# Patient Record
Sex: Male | Born: 1970 | Race: Black or African American | Hispanic: No | Marital: Single | Smoking: Current every day smoker
Health system: Southern US, Community
[De-identification: ages and names within clinical notes are randomized; demographics above are authoritative.]

## PROBLEM LIST (undated history)

## (undated) DIAGNOSIS — M5136 Other intervertebral disc degeneration, lumbar region: Secondary | ICD-10-CM

## (undated) DIAGNOSIS — G8929 Other chronic pain: Secondary | ICD-10-CM

## (undated) DIAGNOSIS — M549 Dorsalgia, unspecified: Secondary | ICD-10-CM

## (undated) HISTORY — PX: ORTHOPEDIC SURGERY: SHX850

---

## 2004-08-20 ENCOUNTER — Ambulatory Visit (HOSPITAL_COMMUNITY): Admission: RE | Admit: 2004-08-20 | Discharge: 2004-08-20 | Payer: Self-pay | Admitting: Unknown Physician Specialty

## 2004-08-30 ENCOUNTER — Ambulatory Visit (HOSPITAL_COMMUNITY): Admission: RE | Admit: 2004-08-30 | Discharge: 2004-08-30 | Payer: Self-pay | Admitting: Preventative Medicine

## 2004-09-11 ENCOUNTER — Emergency Department (HOSPITAL_COMMUNITY): Admission: EM | Admit: 2004-09-11 | Discharge: 2004-09-11 | Payer: Self-pay | Admitting: Emergency Medicine

## 2006-11-15 ENCOUNTER — Emergency Department (HOSPITAL_COMMUNITY): Admission: EM | Admit: 2006-11-15 | Discharge: 2006-11-15 | Payer: Self-pay | Admitting: Emergency Medicine

## 2008-01-03 ENCOUNTER — Emergency Department (HOSPITAL_COMMUNITY): Admission: EM | Admit: 2008-01-03 | Discharge: 2008-01-03 | Payer: Self-pay | Admitting: Emergency Medicine

## 2008-06-13 ENCOUNTER — Emergency Department (HOSPITAL_COMMUNITY): Admission: EM | Admit: 2008-06-13 | Discharge: 2008-06-13 | Payer: Self-pay | Admitting: Emergency Medicine

## 2011-01-14 NOTE — Consult Note (Signed)
NAME:  Riley Montgomery, Riley Montgomery NO.:  [id number]   MEDICAL RECORD NO.:  [id number]          PATIENT TYPE:  EMS   LOCATION:  MAJO                         FACILITY:  [facility]   PHYSICIAN:  Karol T. Lazarus Salines, M.D. DATE OF BIRTH:  1971/03/07   DATE OF CONSULTATION:  01/03/2008  DATE OF DISCHARGE:  01/03/2008                                 CONSULTATION   CHIEF COMPLAINT:  Severe sore throat.   HISTORY:  A 40 year old black male began with a fairly severe left-sided  sore throat 4-5 days ago.  He really has not had any to eat since that  time.  He is drinking with difficulty.  He has had low-grade fevers,  undocumented.  Trismus, left referred ear pain, left neck swelling, and  tenderness.  He was seen in the emergency room at [facility] 2  days ago and given a shot of antibiotics which he cannot name.  He has  continued to have trouble.  He saw Dr. Freida Busman at the [facility] Emergency  Room this evening who diagnosed him with a left peritonsillar abscess  and sent him down for further consideration.  He has never had problems  like this before including tonsillitis or strep throat.  He is not  diabetic nor HIV positive.  No known immune compromise.  No medical  allergies.   PAST MEDICAL HISTORY:  No known allergies.   CURRENT MEDICATIONS:  No current medications.   SOCIAL HISTORY:  He is working in [place].  He is not married.  He does  smoke.   FAMILY HISTORY:  Noncontributory.   REVIEW OF SYSTEMS:  Noncontributory.   PHYSICAL EXAMINATION:  GENERAL:  This is a distressed young middle-aged  black male.  Mental status is appropriate.  He hears well in  conversational speech.  Voice is phonatory, but with labored  articulation consistent with pain.  He is clearing his throat and  spitting frequently.  He is mildly warm to touch.  HEENT:  Head is atraumatic and neck supple.  Cranial nerves grossly  intact.  Ear canals are clear with normal aerated drums.  Anterior nose  is  clear and noncongested.  Oral cavity is moist with teeth in good  repair.  Oropharynx shows fairly massive uvular translucent edema with a  shift of the midline of the soft palate towards the left.  There is  erythematous tenderness with mild bulging of the left hemi soft palate.  The tonsil shows no active exudate.  The posterior tonsillar pillars are  also edematous.  The right tonsil is 2+ without exudate.  NECK:  With some tender adenopathy in the left jugulodigastric region.   IMPRESSION:  Left peritonsillar abscess versus cellulitis with the  extreme level of pain, it is probably a deep abscess, although the time  duration is relatively short.   PLAN:  I discussed this with him.  I think the options are to cover him  with appropriate antibiotics or to go ahead and perform incision and  drainage.  With the uvular edema and the degree of pain, I think opening  this is appropriate.  I discussed this with the patient including risks  and complications.  Questions were answered and informed consent was  obtained.   Anesthesia was begun with topical Cetacaine spray followed by 1%  Xylocaine with 1:100,000 epinephrine, 10 mL total in stages.  Midway  through the anesthesia, he was having substantial aggravation of his gag  reflex with dry heaves.  We gave him 6 mg of IV Zofran which helped with  the nausea.   After allowing adequate time for the local anesthetic to take effect, a  2-cm crescent incision was made over the superior pole of the left  tonsil and carried down to the superior pole.  No abscess pocket was  encountered.  Using a blunt tonsil hemostat, the posterior aspect of  superior pole and down to the mid pole was opened with significant pain.  Minimal pus was encountered.  The patient had mild bleeding which  stopped spontaneously with the use of peroxide and saline gargles.  We  did give him 4 mg of IV morphine at the conclusion of the procedure to  help him with the  pain.   I gave him prescriptions for amoxicillin liquid 1 g t.i.d. for 3 days to  back off the 1/2 g t.i.d. for the remainder of 10 days.  I gave him  prescriptions for Tylenol with Codeine liquid for pain relief and  Roxicet liquid for severe pain relief.  I will see him back in my office  in 1-2 weeks' if he is doing nicely.  If he is still having trouble, we  will see him sooner.  Because he does not have a substantial past  history of tonsil problems, I do not think he is a candidate for  tonsillectomy.  He understands and agrees with the discussion and plans.      Gloris Manchester. Lazarus Salines, M.D.  Electronically Signed     KTW/MEDQ  D:  01/03/2008  T:  01/04/2008  Job:  [id number]

## 2011-06-03 LAB — DIFFERENTIAL
Basophils Absolute: 0
Basophils Relative: 1
Eosinophils Absolute: 0
Eosinophils Relative: 0
Lymphocytes Relative: 37
Lymphs Abs: 1.6
Monocytes Absolute: 0.4
Monocytes Relative: 9
Neutro Abs: 2.3
Neutrophils Relative %: 54

## 2011-06-03 LAB — BASIC METABOLIC PANEL
BUN: 12
CO2: 20
Calcium: 8.9
Chloride: 96
Creatinine, Ser: 0.99
GFR calc Af Amer: >60
GFR calc non Af Amer: >60
Glucose, Bld: 74
Potassium: 4
Sodium: 134 — ABNORMAL LOW

## 2011-06-03 LAB — CBC
HCT: 54 — ABNORMAL HIGH
Hemoglobin: 18.7 — ABNORMAL HIGH
MCHC: 34.6
MCV: 92.6
Platelets: 158
RBC: 5.84 — ABNORMAL HIGH
RDW: 14.7
WBC: 4.2

## 2011-06-03 LAB — RAPID URINE DRUG SCREEN, HOSP PERFORMED
Amphetamines: NOT DETECTED
Barbiturates: NOT DETECTED
Benzodiazepines: NOT DETECTED
Cocaine: NOT DETECTED
Opiates: NOT DETECTED
Tetrahydrocannabinol: NOT DETECTED

## 2011-06-03 LAB — ETHANOL: Alcohol, Ethyl (B): 264 — ABNORMAL HIGH

## 2012-11-21 ENCOUNTER — Emergency Department (HOSPITAL_COMMUNITY): Payer: No Typology Code available for payment source

## 2012-11-21 ENCOUNTER — Emergency Department (HOSPITAL_COMMUNITY)
Admission: EM | Admit: 2012-11-21 | Discharge: 2012-11-21 | Disposition: A | Discharge status: Home / Self Care | Payer: No Typology Code available for payment source | Attending: Emergency Medicine | Admitting: Emergency Medicine

## 2012-11-21 ENCOUNTER — Encounter (HOSPITAL_COMMUNITY): Payer: Self-pay

## 2012-11-21 DIAGNOSIS — F17200 Nicotine dependence, unspecified, uncomplicated: Secondary | ICD-10-CM | POA: Insufficient documentation

## 2012-11-21 DIAGNOSIS — S161XXA Strain of muscle, fascia and tendon at neck level, initial encounter: Secondary | ICD-10-CM

## 2012-11-21 DIAGNOSIS — S139XXA Sprain of joints and ligaments of unspecified parts of neck, initial encounter: Secondary | ICD-10-CM | POA: Insufficient documentation

## 2012-11-21 DIAGNOSIS — S335XXA Sprain of ligaments of lumbar spine, initial encounter: Secondary | ICD-10-CM | POA: Insufficient documentation

## 2012-11-21 DIAGNOSIS — Y92410 Unspecified street and highway as the place of occurrence of the external cause: Secondary | ICD-10-CM | POA: Insufficient documentation

## 2012-11-21 DIAGNOSIS — S39012D Strain of muscle, fascia and tendon of lower back, subsequent encounter: Secondary | ICD-10-CM

## 2012-11-21 DIAGNOSIS — Y9389 Activity, other specified: Secondary | ICD-10-CM | POA: Insufficient documentation

## 2012-11-21 DIAGNOSIS — Z8739 Personal history of other diseases of the musculoskeletal system and connective tissue: Secondary | ICD-10-CM | POA: Insufficient documentation

## 2012-11-21 HISTORY — DX: Other intervertebral disc degeneration, lumbar region: M51.36

## 2012-11-21 HISTORY — DX: Dorsalgia, unspecified: M54.9

## 2012-11-21 HISTORY — DX: Other chronic pain: G89.29

## 2012-11-21 MED ORDER — OXYCODONE-ACETAMINOPHEN 5-325 MG PO TABS: ORAL_TABLET | ORAL | Status: DC

## 2012-11-21 MED ORDER — OXYCODONE-ACETAMINOPHEN 5-325 MG PO TABS
2.0000 | ORAL_TABLET | Freq: Once | ORAL | Status: AC
  Administered 2012-11-21: 2 via ORAL
  Filled 2012-11-21: qty 2
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
## 2012-11-21 NOTE — ED Notes (Signed)
Patient states pain is just a little better.

## 2013-02-13 ENCOUNTER — Encounter (HOSPITAL_COMMUNITY): Payer: Self-pay | Admitting: Emergency Medicine

## 2013-02-13 ENCOUNTER — Emergency Department (HOSPITAL_COMMUNITY): Payer: Worker's Compensation

## 2013-02-13 ENCOUNTER — Emergency Department (HOSPITAL_COMMUNITY)
Admission: EM | Admit: 2013-02-13 | Discharge: 2013-02-13 | Disposition: A | Discharge status: Home / Self Care | Payer: Worker's Compensation | Attending: Emergency Medicine | Admitting: Emergency Medicine

## 2013-02-13 DIAGNOSIS — Z8739 Personal history of other diseases of the musculoskeletal system and connective tissue: Secondary | ICD-10-CM | POA: Insufficient documentation

## 2013-02-13 DIAGNOSIS — Y929 Unspecified place or not applicable: Secondary | ICD-10-CM | POA: Insufficient documentation

## 2013-02-13 DIAGNOSIS — S63509A Unspecified sprain of unspecified wrist, initial encounter: Secondary | ICD-10-CM | POA: Insufficient documentation

## 2013-02-13 DIAGNOSIS — M549 Dorsalgia, unspecified: Secondary | ICD-10-CM | POA: Insufficient documentation

## 2013-02-13 DIAGNOSIS — Y990 Civilian activity done for income or pay: Secondary | ICD-10-CM | POA: Insufficient documentation

## 2013-02-13 DIAGNOSIS — Y9389 Activity, other specified: Secondary | ICD-10-CM | POA: Insufficient documentation

## 2013-02-13 DIAGNOSIS — F17200 Nicotine dependence, unspecified, uncomplicated: Secondary | ICD-10-CM | POA: Insufficient documentation

## 2013-02-13 DIAGNOSIS — X500XXA Overexertion from strenuous movement or load, initial encounter: Secondary | ICD-10-CM | POA: Insufficient documentation

## 2013-02-13 DIAGNOSIS — G8929 Other chronic pain: Secondary | ICD-10-CM | POA: Insufficient documentation

## 2013-02-13 DIAGNOSIS — S63501A Unspecified sprain of right wrist, initial encounter: Secondary | ICD-10-CM

## 2013-02-13 MED ORDER — IBUPROFEN 600 MG PO TABS: 600.0000 mg | ORAL_TABLET | Freq: Four times a day (QID) | ORAL | Status: DC | PRN

## 2013-02-13 MED ORDER — IBUPROFEN 800 MG PO TABS
800.0000 mg | ORAL_TABLET | Freq: Once | ORAL | Status: AC
Start: 2013-02-13 — End: 2013-02-13
  Administered 2013-02-13: 800 mg via ORAL
  Filled 2013-02-13: qty 1

## 2013-02-13 MED ORDER — OXYCODONE-ACETAMINOPHEN 5-325 MG PO TABS: 1.0000 | ORAL_TABLET | Freq: Three times a day (TID) | ORAL | Status: DC | PRN

## 2013-02-13 NOTE — ED Provider Notes (Signed)
History  This chart was scribed for Riley Gaskins, MD by Greggory Stallion, ED Scribe. This patient was seen in room APA06/APA06 and the patient's care was started at 7:07 AM.  CSN: [id number]  Arrival date & time 02/13/13  2956    Chief Complaint  Patient presents with  . Hand Injury    Patient is a 42 y.o. male presenting with hand injury. The history is provided by the patient. No language interpreter was used.  Hand Injury Location:  Wrist and hand Time since incident:  1 hour Injury: yes   Wrist location:  R wrist Hand location:  R hand Pain details:    Severity:  Mild   Onset quality:  Gradual   Timing:  Constant Chronicity:  New Dislocation: no   Foreign body present:  No foreign bodies Relieved by:  None tried Worsened by:  Movement Ineffective treatments:  None tried   HPI Comments: Riley Montgomery is a 42 y.o. male who presents to the Emergency Department complaining of gradual onset, constant right hand pain that started earlier this morning. Pt states he was breaking down boxes at work and he heard a pop in his hand with a shooting pain. Pt states the pain when away then a few minutes later his wrist started swelling really bad. Pt denies any other medical problems at this time.  Past Medical History  Diagnosis Date  . Chronic back pain   . DDD (degenerative disc disease), lumbar     No past surgical history on file.  No family history on file.  History  Substance Use Topics  . Smoking status: Current Some Day Smoker  . Smokeless tobacco: Not on file  . Alcohol Use: No      Review of Systems  Gastrointestinal: Negative for abdominal pain.  Musculoskeletal: Positive for joint swelling (wrist).    Allergies  Review of patient's allergies indicates no known allergies.  Home Medications   Current Outpatient Rx  Name  Route  Sig  Dispense  Refill  . methocarbamol (ROBAXIN) 750 MG tablet   Oral   Take 1,500 mg by mouth 4 (four) times daily as  needed (for pain).         Marland Kitchen oxyCODONE-acetaminophen (PERCOCET/ROXICET) 5-325 MG per tablet      1 or 2 tabs PO q6h prn pain   20 tablet   0     BP 144/92  Pulse 119  Temp(Src) 98.6 F (37 C) (Oral)  Resp 20  Ht 5\' 9"  (1.753 m)  Wt 230 lb (104.327 kg)  BMI 33.95 kg/m2  SpO2 99%  Physical Exam  CONSTITUTIONAL: Well developed/well nourished HEAD: Normocephalic/atraumatic EYES: EOMI/PERRL  ENMT: Mucous membranes moist NECK: supple no meningeal signs SPINE:entire spine nontender CV: S1/S2 noted, no murmurs/rubs/gallops noted LUNGS: Lungs are clear to auscultation bilaterally, no apparent distress ABDOMEN: soft, nontender, no rebound or guarding GU:no cva tenderness NEURO: Pt is awake/alert, moves all extremitiesx4 EXTREMITIES: pulses normal, full ROM, swelling and tenderness over the right wrist mostly over the snuff box but no erythema or laceration/abrasion.  Minimal warmth.  He can range the right wrist, most of his pain is with radial deviation of wrist.  No deformity SKIN: warm, color normal PSYCH: no abnormalities of mood noted   ED Course  Procedures   DIAGNOSTIC STUDIES: Oxygen Saturation is 99% on RA, normal by my interpretation.    COORDINATION OF CARE: 7:13 AM-Discussed treatment plan with pt at bedside and pt agreed to plan.  Plan for velcro thumb spica, will f/u this week with ortho Short course of pain meds given     MDM  Nursing notes including past medical history and social history reviewed and considered in documentation xrays reviewed and considered       I personally performed the services described in this documentation, which was scribed in my presence. The recorded information has been reviewed and is accurate.     Riley Gaskins, MD 02/13/13 [phone]

## 2013-02-13 NOTE — ED Notes (Signed)
States that he was at work and felt and heard something pop in his right wrist and hand and started having pain and swelling.

## 2013-03-08 ENCOUNTER — Other Ambulatory Visit: Payer: Self-pay | Admitting: Orthopedic Surgery

## 2013-03-08 ENCOUNTER — Ambulatory Visit
Admission: RE | Admit: 2013-03-08 | Discharge: 2013-03-08 | Disposition: A | Discharge status: Home / Self Care | Payer: Worker's Compensation | Source: Ambulatory Visit | Attending: Orthopedic Surgery | Admitting: Orthopedic Surgery

## 2013-03-08 DIAGNOSIS — M25531 Pain in right wrist: Secondary | ICD-10-CM

## 2013-03-08 DIAGNOSIS — R609 Edema, unspecified: Secondary | ICD-10-CM

## 2013-11-15 ENCOUNTER — Emergency Department (HOSPITAL_COMMUNITY): Payer: Self-pay

## 2013-11-15 ENCOUNTER — Emergency Department (HOSPITAL_COMMUNITY)
Admission: EM | Admit: 2013-11-15 | Discharge: 2013-11-15 | Disposition: A | Discharge status: Home / Self Care | Payer: Self-pay | Attending: Emergency Medicine | Admitting: Emergency Medicine

## 2013-11-15 ENCOUNTER — Encounter (HOSPITAL_COMMUNITY): Payer: Self-pay | Admitting: Emergency Medicine

## 2013-11-15 DIAGNOSIS — R296 Repeated falls: Secondary | ICD-10-CM | POA: Insufficient documentation

## 2013-11-15 DIAGNOSIS — M25531 Pain in right wrist: Secondary | ICD-10-CM

## 2013-11-15 DIAGNOSIS — F17200 Nicotine dependence, unspecified, uncomplicated: Secondary | ICD-10-CM | POA: Insufficient documentation

## 2013-11-15 DIAGNOSIS — S59919A Unspecified injury of unspecified forearm, initial encounter: Principal | ICD-10-CM

## 2013-11-15 DIAGNOSIS — Z8739 Personal history of other diseases of the musculoskeletal system and connective tissue: Secondary | ICD-10-CM | POA: Insufficient documentation

## 2013-11-15 DIAGNOSIS — S6990XA Unspecified injury of unspecified wrist, hand and finger(s), initial encounter: Principal | ICD-10-CM | POA: Insufficient documentation

## 2013-11-15 DIAGNOSIS — S59909A Unspecified injury of unspecified elbow, initial encounter: Secondary | ICD-10-CM | POA: Insufficient documentation

## 2013-11-15 DIAGNOSIS — Y9389 Activity, other specified: Secondary | ICD-10-CM | POA: Insufficient documentation

## 2013-11-15 DIAGNOSIS — G8929 Other chronic pain: Secondary | ICD-10-CM | POA: Insufficient documentation

## 2013-11-15 DIAGNOSIS — Y929 Unspecified place or not applicable: Secondary | ICD-10-CM | POA: Insufficient documentation

## 2013-11-15 NOTE — ED Provider Notes (Signed)
CSN: [id number]     Arrival date & time 11/15/13  2249 History   First MD Initiated Contact with Patient 11/15/13 2307     No chief complaint on file.    (Consider location/radiation/quality/duration/timing/severity/associated sxs/prior Treatment) Patient is a 43 y.o. male presenting with wrist pain. The history is provided by the patient. No language interpreter was used.  Wrist Pain The current episode started today. Associated symptoms include arthralgias. Pertinent negatives include no chills, coughing, fatigue, fever, joint swelling, myalgias, nausea, numbness, rash, visual change, vomiting or weakness.  Pt is a 43 year old male who presents with wrist pain. He reports that he fell onto his right wrist and is now having wrist pain. No obvious deformity noted. No numbness or tingling. He reports that he took ibuprofen at home tonight and it helped with the pain.   Past Medical History  Diagnosis Date  . Chronic back pain   . DDD (degenerative disc disease), lumbar    History reviewed. No pertinent past surgical history. No family history on file. History  Substance Use Topics  . Smoking status: Current Some Day Smoker  . Smokeless tobacco: Not on file  . Alcohol Use: Yes    Review of Systems  Constitutional: Negative for fever, chills and fatigue.  Respiratory: Negative for cough.   Gastrointestinal: Negative for nausea and vomiting.  Musculoskeletal: Positive for arthralgias. Negative for back pain, joint swelling and myalgias.  Skin: Negative for rash.  Neurological: Negative for weakness and numbness.  All other systems reviewed and are negative.      Allergies  Review of patient's allergies indicates no known allergies.  Home Medications   Current Outpatient Rx  Name  Route  Sig  Dispense  Refill  . ibuprofen (ADVIL,MOTRIN) 600 MG tablet   Oral   Take 1 tablet (600 mg total) by mouth every 6 (six) hours as needed for pain.   30 tablet   0   .  oxyCODONE-acetaminophen (PERCOCET/ROXICET) 5-325 MG per tablet   Oral   Take 1 tablet by mouth every 8 (eight) hours as needed for pain.   15 tablet   0    BP 178/106  Pulse 118  Temp(Src) 98 F (36.7 C) (Oral)  Resp 20  Ht 5\' 9"  (1.753 m)  Wt 250 lb (113.399 kg)  BMI 36.90 kg/m2  SpO2 97% Physical Exam  Nursing note and vitals reviewed. Constitutional: He is oriented to person, place, and time. He appears well-developed and well-nourished. No distress.  HENT:  Head: Normocephalic and atraumatic.  Eyes: Conjunctivae and EOM are normal.  Neck: Normal range of motion. Neck supple. No JVD present. No tracheal deviation present. No thyromegaly present.  Cardiovascular: Normal rate, regular rhythm, normal heart sounds and intact distal pulses.   Pulmonary/Chest: Effort normal and breath sounds normal. No respiratory distress. He has no wheezes.  Abdominal: Soft. Bowel sounds are normal. He exhibits no distension. There is no tenderness.  Musculoskeletal: Normal range of motion.  Full ROM of right wrist and hand with minimal soreness. No numbness or tingling. Brisk capillary refill. Distal pulses 2+.  Lymphadenopathy:    He has no cervical adenopathy.  Neurological: He is alert and oriented to person, place, and time.  Skin: Skin is warm and dry.  Psychiatric: He has a normal mood and affect. His behavior is normal. Judgment and thought content normal.    ED Course  Procedures (including critical care time) Labs Review Labs Reviewed - No data to display Imaging  Review No results found.   EKG Interpretation None      MDM   Final diagnoses:  Wrist pain, right    Right wrist x-ray; no acute finding. Ace wrap applied for support. RICE instructions given. Discussed plan of care with pt and he agrees. He reports that he has meds for pain at home. No numbness or tingling. Good ROM, strength and sensation.      Irish Elders, NP 11/20/13 1557

## 2013-11-15 NOTE — Discharge Instructions (Signed)
Arthralgia °Your caregiver has diagnosed you as suffering from an arthralgia. Arthralgia means there is pain in a joint. This can come from many reasons including: °· Bruising the joint which causes soreness (inflammation) in the joint. °· Wear and tear on the joints which occur as we grow older (osteoarthritis). °· Overusing the joint. °· Various forms of arthritis. °· Infections of the joint. °Regardless of the cause of pain in your joint, most of these different pains respond to anti-inflammatory drugs and rest. The exception to this is when a joint is infected, and these cases are treated with antibiotics, if it is a bacterial infection. °HOME CARE INSTRUCTIONS  °· Rest the injured area for as long as directed by your caregiver. Then slowly start using the joint as directed by your caregiver and as the pain allows. Crutches as directed may be useful if the ankles, knees or hips are involved. If the knee was splinted or casted, continue use and care as directed. If an stretchy or elastic wrapping bandage has been applied today, it should be removed and re-applied every 3 to 4 hours. It should not be applied tightly, but firmly enough to keep swelling down. Watch toes and feet for swelling, bluish discoloration, coldness, numbness or excessive pain. If any of these problems (symptoms) occur, remove the ace bandage and re-apply more loosely. If these symptoms persist, contact your caregiver or return to this location. °· For the first 24 hours, keep the injured extremity elevated on pillows while lying down. °· Apply ice for 15-20 minutes to the sore joint every couple hours while awake for the first half day. Then 03-04 times per day for the first 48 hours. Put the ice in a plastic bag and place a towel between the bag of ice and your skin. °· Wear any splinting, casting, elastic bandage applications, or slings as instructed. °· Only take over-the-counter or prescription medicines for pain, discomfort, or fever as  directed by your caregiver. Do not use aspirin immediately after the injury unless instructed by your physician. Aspirin can cause increased bleeding and bruising of the tissues. °· If you were given crutches, continue to use them as instructed and do not resume weight bearing on the sore joint until instructed. °Persistent pain and inability to use the sore joint as directed for more than 2 to 3 days are warning signs indicating that you should see a caregiver for a follow-up visit as soon as possible. Initially, a hairline fracture (break in bone) may not be evident on X-rays. Persistent pain and swelling indicate that further evaluation, non-weight bearing or use of the joint (use of crutches or slings as instructed), or further X-rays are indicated. X-rays may sometimes not show a small fracture until a week or 10 days later. Make a follow-up appointment with your own caregiver or one to whom we have referred you. A radiologist (specialist in reading X-rays) may read your X-rays. Make sure you know how you are to obtain your X-ray results. Do not assume everything is normal if you do not hear from us. °SEEK MEDICAL CARE IF: °Bruising, swelling, or pain increases. °SEEK IMMEDIATE MEDICAL CARE IF:  °· Your fingers or toes are numb or blue. °· The pain is not responding to medications and continues to stay the same or get worse. °· The pain in your joint becomes severe. °· You develop a fever over 102° F (38.9° C). °· It becomes impossible to move or use the joint. °MAKE SURE YOU:  °·   Understand these instructions.  Will watch your condition.  Will get help right away if you are not doing well or get worse. Document Released: 08/18/2005 Document Revised: 11/10/2011 Document Reviewed: 04/05/2008 [facility] Patient Information 2014 [place], [place].   Take your pain meds at home Wear an ace wrap for support  Ice as needed  No fracture or dislocation

## 2013-11-15 NOTE — ED Notes (Signed)
Pt states he fell tonight and having pain and swelling to right wrist, states he doesn't want pain meds b/c he took ibuprofen at home tonight.

## 2013-11-15 NOTE — ED Notes (Signed)
Right wrist swollen, painful

## 2013-11-23 ENCOUNTER — Emergency Department (HOSPITAL_COMMUNITY)
Admission: EM | Admit: 2013-11-23 | Discharge: 2013-11-23 | Disposition: A | Discharge status: Home / Self Care | Payer: Self-pay | Attending: Emergency Medicine | Admitting: Emergency Medicine

## 2013-11-23 ENCOUNTER — Encounter (HOSPITAL_COMMUNITY): Payer: Self-pay | Admitting: Emergency Medicine

## 2013-11-23 ENCOUNTER — Emergency Department (HOSPITAL_COMMUNITY): Payer: Self-pay

## 2013-11-23 DIAGNOSIS — I10 Essential (primary) hypertension: Secondary | ICD-10-CM | POA: Insufficient documentation

## 2013-11-23 DIAGNOSIS — Z8739 Personal history of other diseases of the musculoskeletal system and connective tissue: Secondary | ICD-10-CM | POA: Insufficient documentation

## 2013-11-23 DIAGNOSIS — F17200 Nicotine dependence, unspecified, uncomplicated: Secondary | ICD-10-CM | POA: Insufficient documentation

## 2013-11-23 DIAGNOSIS — F430 Acute stress reaction: Secondary | ICD-10-CM | POA: Insufficient documentation

## 2013-11-23 DIAGNOSIS — R000 Tachycardia, unspecified: Secondary | ICD-10-CM | POA: Insufficient documentation

## 2013-11-23 DIAGNOSIS — R0789 Other chest pain: Secondary | ICD-10-CM | POA: Insufficient documentation

## 2013-11-23 DIAGNOSIS — F411 Generalized anxiety disorder: Secondary | ICD-10-CM | POA: Insufficient documentation

## 2013-11-23 DIAGNOSIS — R002 Palpitations: Secondary | ICD-10-CM | POA: Insufficient documentation

## 2013-11-23 DIAGNOSIS — F419 Anxiety disorder, unspecified: Secondary | ICD-10-CM

## 2013-11-23 DIAGNOSIS — G8929 Other chronic pain: Secondary | ICD-10-CM | POA: Insufficient documentation

## 2013-11-23 LAB — CBC WITH DIFFERENTIAL/PLATELET
Basophils Absolute: 0 10*3/uL (ref 0.0–0.1)
Basophils Relative: 1 % (ref 0–1)
Eosinophils Absolute: 0 10*3/uL (ref 0.0–0.7)
Eosinophils Relative: 1 % (ref 0–5)
HCT: 49.1 % (ref 39.0–52.0)
Hemoglobin: 16.5 g/dL (ref 13.0–17.0)
Lymphocytes Relative: 33 % (ref 12–46)
Lymphs Abs: 2 10*3/uL (ref 0.7–4.0)
MCH: 30.8 pg (ref 26.0–34.0)
MCHC: 33.6 g/dL (ref 30.0–36.0)
MCV: 91.8 fL (ref 78.0–100.0)
Monocytes Absolute: 0.3 10*3/uL (ref 0.1–1.0)
Monocytes Relative: 5 % (ref 3–12)
Neutro Abs: 3.7 10*3/uL (ref 1.7–7.7)
Neutrophils Relative %: 61 % (ref 43–77)
Platelets: 164 10*3/uL (ref 150–400)
RBC: 5.35 MIL/uL (ref 4.22–5.81)
RDW: 13.2 % (ref 11.5–15.5)
WBC: 6 10*3/uL (ref 4.0–10.5)

## 2013-11-23 LAB — COMPREHENSIVE METABOLIC PANEL
ALT: 25 U/L (ref 0–53)
AST: 21 U/L (ref 0–37)
Albumin: 4 g/dL (ref 3.5–5.2)
Alkaline Phosphatase: 59 U/L (ref 39–117)
BUN: 8 mg/dL (ref 6–23)
CO2: 23 mEq/L (ref 19–32)
Calcium: 9.2 mg/dL (ref 8.4–10.5)
Chloride: 105 mEq/L (ref 96–112)
Creatinine, Ser: 1.01 mg/dL (ref 0.50–1.35)
GFR calc Af Amer: >90 mL/min (ref >90)
GFR calc non Af Amer: >90 mL/min (ref >90)
Glucose, Bld: 177 mg/dL — ABNORMAL HIGH (ref 70–99)
Potassium: 3.8 mEq/L (ref 3.7–5.3)
Sodium: 141 mEq/L (ref 137–147)
Total Bilirubin: 0.4 mg/dL (ref 0.3–1.2)
Total Protein: 7.3 g/dL (ref 6.0–8.3)

## 2013-11-23 LAB — RAPID URINE DRUG SCREEN, HOSP PERFORMED
Amphetamines: NOT DETECTED
Barbiturates: NOT DETECTED
Benzodiazepines: NOT DETECTED
Cocaine: NOT DETECTED
OPIATES: NOT DETECTED
Tetrahydrocannabinol: NOT DETECTED

## 2013-11-23 LAB — TROPONIN I: Troponin I: <0.3 ng/mL (ref <0.30)

## 2013-11-23 LAB — D-DIMER, QUANTITATIVE: D-Dimer, Quant: <0.27 ug/mL-FEU (ref 0.00–0.48)

## 2013-11-23 MED ORDER — METOPROLOL TARTRATE 1 MG/ML IV SOLN
5.0000 mg | Freq: Once | INTRAVENOUS | Status: AC
  Administered 2013-11-23: 5 mg via INTRAVENOUS
  Filled 2013-11-23: qty 5

## 2013-11-23 MED ORDER — METOPROLOL TARTRATE 50 MG PO TABS: 50.0000 mg | ORAL_TABLET | Freq: Two times a day (BID) | ORAL | Status: DC

## 2013-11-23 MED ORDER — LORAZEPAM 2 MG/ML IJ SOLN
1.0000 mg | Freq: Once | INTRAMUSCULAR | Status: AC
  Administered 2013-11-23: 1 mg via INTRAVENOUS
  Filled 2013-11-23: qty 1

## 2013-11-23 MED ORDER — LORAZEPAM 1 MG PO TABS: 1.0000 mg | ORAL_TABLET | Freq: Three times a day (TID) | ORAL | Status: DC | PRN
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
## 2013-11-23 NOTE — ED Notes (Signed)
Patient with no complaints at this time. Respirations even and unlabored. Skin warm/dry. Discharge instructions reviewed with patient at this time. Patient given opportunity to voice concerns/ask questions. IV removed per policy and band-aid applied to site. Patient discharged at this time and left Emergency Department with steady gait.  

## 2013-11-23 NOTE — ED Provider Notes (Signed)
Medical screening examination/treatment/procedure(s) were performed by non-physician practitioner and as supervising physician I was immediately available for consultation/collaboration.   EKG Interpretation None       Edrei Norgaard, MD 11/23/13 1241 

## 2013-11-23 NOTE — Discharge Instructions (Signed)
Follow up with a family md next week °

## 2013-11-23 NOTE — ED Provider Notes (Signed)
CSN: [id number]     Arrival date & time 11/23/13  0809 History   First MD Initiated Contact with Patient 11/23/13 [phone]  This chart was scribed for Benny LennertJoseph L Serrita Lueth, MD by Valera CastleSteven Perry, ED Scribe. This patient was seen in room APA05/APA05 and the patient's care was started at 8:42 AM.    Chief Complaint  Patient presents with  . Irregular Heart Beat   (Consider location/radiation/quality/duration/timing/severity/associated sxs/prior Treatment) Patient is a 43 y.o. male presenting with palpitations. The history is provided by the patient. No language interpreter was used.  Palpitations Palpitations quality:  Irregular Onset quality:  Sudden Duration: earlier this morning. Progression:  Improving Chronicity:  New Context: anxiety   Associated symptoms: chest pain   Associated symptoms: no back pain, no cough and no shortness of breath   HPI Comments: Riley Montgomery is a 43 y.o. male brought in by EMS, who presents to the Emergency Department complaining of an irregular heart beat, with an associated episode of sharp chest pain, onset earlier this morning due to stress. He reports being stressed out about the recent surgery over his right hand and about work. He has anxiety about getting fired. He denies SOB, fever, chills, cough, and any other associated symptoms.   Per EMS, pt's heart rate was 190 upon arrival. Pt was given Adenosine 6 mg, 12mg , and 12 mg. Pt has splint to right hand from a work injury.   PCP - No PCP Per Patient   Past Medical History  Diagnosis Date  . Chronic back pain   . DDD (degenerative disc disease), lumbar    Past Surgical History  Procedure Laterality Date  . Orthopedic surgery     No family history on file. History  Substance Use Topics  . Smoking status: Current Some Day Smoker  . Smokeless tobacco: Not on file  . Alcohol Use: Yes    Review of Systems  Constitutional: Negative for fever, appetite change and fatigue.  HENT: Negative for  congestion, ear discharge and sinus pressure.   Eyes: Negative for discharge.  Respiratory: Negative for cough and shortness of breath.   Cardiovascular: Positive for chest pain and palpitations.  Gastrointestinal: Negative for abdominal pain and diarrhea.  Genitourinary: Negative for frequency and hematuria.  Musculoskeletal: Negative for back pain.  Skin: Negative for rash.  Neurological: Negative for seizures and headaches.  Psychiatric/Behavioral: Negative for hallucinations. The patient is nervous/anxious.    Allergies  Review of patient's allergies indicates no known allergies.  Home Medications   Current Outpatient Rx  Name  Route  Sig  Dispense  Refill  . ibuprofen (ADVIL,MOTRIN) 200 MG tablet   Oral   Take 800 mg by mouth every 6 (six) hours as needed for moderate pain.          Ht 5\' 9"  (1.753 m)  Wt 250 lb (113.399 kg)  BMI 36.90 kg/m2  Physical Exam  Nursing note and vitals reviewed. Constitutional: He is oriented to person, place, and time. He appears well-developed. He appears distressed.  HENT:  Head: Normocephalic.  Eyes: Conjunctivae and EOM are normal. No scleral icterus.  Neck: Neck supple. No thyromegaly present.  Cardiovascular: Regular rhythm.  Tachycardia present.  Exam reveals no gallop and no friction rub.   No murmur heard. Palpitations  Pulmonary/Chest: No stridor. He has no wheezes. He has no rales. He exhibits no tenderness.  Abdominal: He exhibits no distension. There is no tenderness. There is no rebound.  Musculoskeletal: Normal range of motion. He  exhibits no edema.  Lymphadenopathy:    He has no cervical adenopathy.  Neurological: He is oriented to person, place, and time. He exhibits normal muscle tone. Coordination normal.  Skin: No rash noted. No erythema.  Psychiatric: His behavior is normal. His mood appears anxious.    ED Course  Procedures (including critical care time)  COORDINATION OF CARE: 8:45 AM-Discussed treatment  plan which includes CXR and blood work with pt at bedside and pt agreed to plan.   Results for orders placed during the hospital encounter of 11/23/13  CBC WITH DIFFERENTIAL      Result Value Ref Range   WBC 6.0  4.0 - 10.5 K/uL   RBC 5.35  4.22 - 5.81 MIL/uL   Hemoglobin 16.5  13.0 - 17.0 g/dL   HCT 16.1  09.6 - 04.5 %   MCV 91.8  78.0 - 100.0 fL   MCH 30.8  26.0 - 34.0 pg   MCHC 33.6  30.0 - 36.0 g/dL   RDW 40.9  81.1 - 91.4 %   Platelets 164  150 - 400 K/uL   Neutrophils Relative % 61  43 - 77 %   Neutro Abs 3.7  1.7 - 7.7 K/uL   Lymphocytes Relative 33  12 - 46 %   Lymphs Abs 2.0  0.7 - 4.0 K/uL   Monocytes Relative 5  3 - 12 %   Monocytes Absolute 0.3  0.1 - 1.0 K/uL   Eosinophils Relative 1  0 - 5 %   Eosinophils Absolute 0.0  0.0 - 0.7 K/uL   Basophils Relative 1  0 - 1 %   Basophils Absolute 0.0  0.0 - 0.1 K/uL  COMPREHENSIVE METABOLIC PANEL      Result Value Ref Range   Sodium 141  137 - 147 mEq/L   Potassium 3.8  3.7 - 5.3 mEq/L   Chloride 105  96 - 112 mEq/L   CO2 23  19 - 32 mEq/L   Glucose, Bld 177 (*) 70 - 99 mg/dL   BUN 8  6 - 23 mg/dL   Creatinine, Ser 7.82  0.50 - 1.35 mg/dL   Calcium 9.2  8.4 - 95.6 mg/dL   Total Protein 7.3  6.0 - 8.3 g/dL   Albumin 4.0  3.5 - 5.2 g/dL   AST 21  0 - 37 U/L   ALT 25  0 - 53 U/L   Alkaline Phosphatase 59  39 - 117 U/L   Total Bilirubin 0.4  0.3 - 1.2 mg/dL   GFR calc non Af Amer >90  >90 mL/min   GFR calc Af Amer >90  >90 mL/min  TROPONIN I      Result Value Ref Range   Troponin I <0.30  <0.30 ng/mL  URINE RAPID DRUG SCREEN (HOSP PERFORMED)      Result Value Ref Range   Opiates NONE DETECTED  NONE DETECTED   Cocaine NONE DETECTED  NONE DETECTED   Benzodiazepines NONE DETECTED  NONE DETECTED   Amphetamines NONE DETECTED  NONE DETECTED   Tetrahydrocannabinol NONE DETECTED  NONE DETECTED   Barbiturates NONE DETECTED  NONE DETECTED  D-DIMER, QUANTITATIVE      Result Value Ref Range   D-Dimer, Quant <0.27  0.00 -  0.48 ug/mL-FEU   Dg Chest Portable 1 View  11/23/2013   CLINICAL DATA:  Irregular heartbeat  EXAM: PORTABLE CHEST - 1 VIEW  COMPARISON:  DG CHEST 2 VIEW dated 11/21/2012  FINDINGS: Exam is lordotic. Normal mediastinum  and cardiac silhouette. Normal pulmonary vasculature. No evidence of effusion, infiltrate, or pneumothorax. No acute bony abnormality.  IMPRESSION: No acute cardiopulmonary process.   Electronically Signed   By: Genevive Bi M.D.   On: 11/23/2013 09:16    EKG Interpretation   Date/Time:  Wednesday November 23 2013 08:14:01 EDT Ventricular Rate:  127 PR Interval:  144 QRS Duration: 102 QT Interval:  330 QTC Calculation: 479 R Axis:   36 Text Interpretation:  Sinus tachycardia Otherwise normal ECG No previous  ECGs available Confirmed by Keante Urizar  MD, [facility] [phone]) on 11/23/2013  11:31:43 AM     Medications  LORazepam (ATIVAN) injection 1 mg (1 mg Intravenous Given 11/23/13 0908)  metoprolol (LOPRESSOR) injection 5 mg (5 mg Intravenous Given 11/23/13 0908)  metoprolol (LOPRESSOR) injection 5 mg (5 mg Intravenous Given 11/23/13 0950)   MDM   Final diagnoses:  None   Anxiety and htn The chart was scribed for me under my direct supervision.  I personally performed the history, physical, and medical decision making and all procedures in the evaluation of this patient.Benny Lennert, MD 11/23/13 1136

## 2013-11-23 NOTE — ED Notes (Signed)
Per EMS, pt 's heart rate was 190 when they picked him up. Pt was given adenosine 6 mg, 12 mg and then 12 mg. Pt states he has been under a lot of stress. Pt has splint to right hand from a work injury per pt

## 2013-12-15 IMAGING — CT CT WRIST*R* W/O CM
1 of 6 series · 3 of 14 positions shown, 4 images · non-contrast
Comparison: Plain films of the right wrist 02/13/2013.

CLINICAL DATA: Injury December 2012.  Right wrist pain, swelling and
decreased range of motion.  Question fracture.

CT OF THE RIGHT WRIST WITHOUT CONTRAST
TECHNIQUE: Multidetector CT imaging was performed according to the
standard protocol. Multiplanar CT image reconstructions were also
generated.

[Series 2: wrist/hand bone · axial · 0.29mm/px · z∈[-77,+38]mm · 3 of 47 slices shown, 4 images]
[im 1/47  soft-tissue]
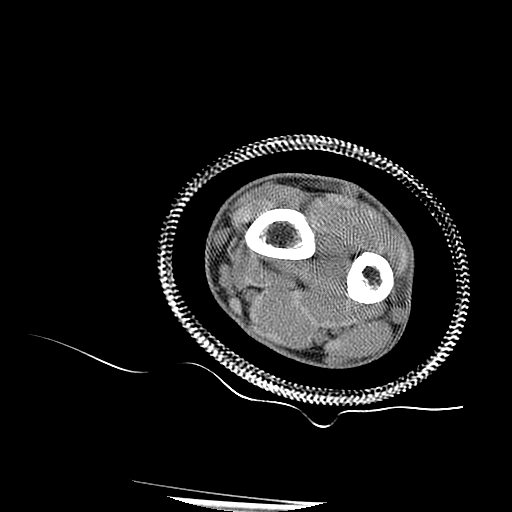
[im 1/47  bone]
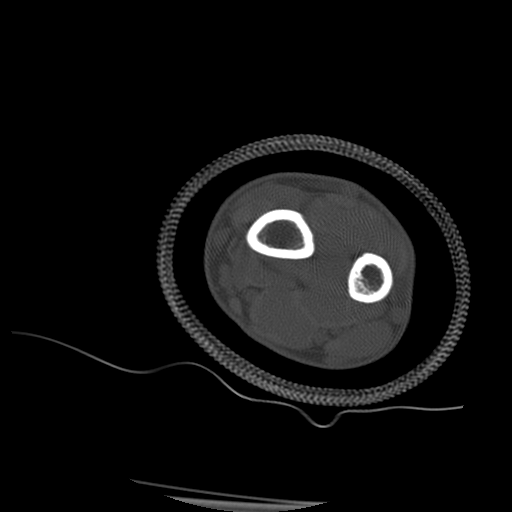
[im 24/47  bone]
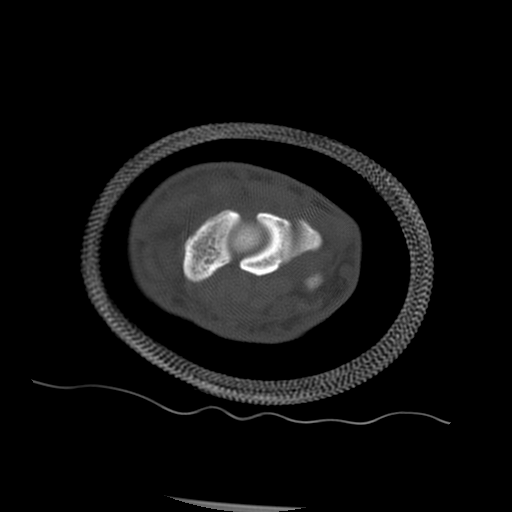
[im 47/47  bone]
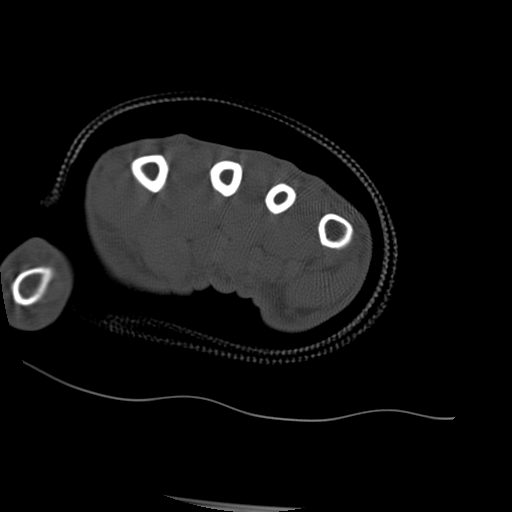

[3 of 14 positions shown; findings below may reference images not displayed]

FINDINGS: The patient is in a cast.  No acute or remote fracture is
seen. Tiny accessory ossicles are seen about the base of the third
metacarpal.  There is an exostosis off the volar margin of the base
of the second metacarpal.  Soft tissue structures are unremarkable.
No fluid collection or mass is identified.
IMPRESSION: 1.  Negative for acute or subacute fracture.  No evidence of post-
traumatic change is identified.
2.  Small exostosis off the volar margin of the base of the second
metacarpal.

## 2014-05-24 ENCOUNTER — Encounter (HOSPITAL_COMMUNITY): Payer: Self-pay | Admitting: Emergency Medicine

## 2014-05-24 ENCOUNTER — Emergency Department (HOSPITAL_COMMUNITY)
Admission: EM | Admit: 2014-05-24 | Discharge: 2014-05-24 | Disposition: A | Discharge status: Home / Self Care | Payer: No Typology Code available for payment source | Attending: Emergency Medicine | Admitting: Emergency Medicine

## 2014-05-24 DIAGNOSIS — M545 Low back pain, unspecified: Secondary | ICD-10-CM

## 2014-05-24 DIAGNOSIS — T148XXA Other injury of unspecified body region, initial encounter: Secondary | ICD-10-CM

## 2014-05-24 DIAGNOSIS — Y9389 Activity, other specified: Secondary | ICD-10-CM | POA: Insufficient documentation

## 2014-05-24 DIAGNOSIS — IMO0002 Reserved for concepts with insufficient information to code with codable children: Secondary | ICD-10-CM | POA: Insufficient documentation

## 2014-05-24 DIAGNOSIS — G8929 Other chronic pain: Secondary | ICD-10-CM | POA: Insufficient documentation

## 2014-05-24 DIAGNOSIS — S335XXA Sprain of ligaments of lumbar spine, initial encounter: Secondary | ICD-10-CM | POA: Insufficient documentation

## 2014-05-24 DIAGNOSIS — S239XXA Sprain of unspecified parts of thorax, initial encounter: Secondary | ICD-10-CM | POA: Insufficient documentation

## 2014-05-24 DIAGNOSIS — Z79899 Other long term (current) drug therapy: Secondary | ICD-10-CM | POA: Insufficient documentation

## 2014-05-24 DIAGNOSIS — F17200 Nicotine dependence, unspecified, uncomplicated: Secondary | ICD-10-CM | POA: Insufficient documentation

## 2014-05-24 DIAGNOSIS — Y92410 Unspecified street and highway as the place of occurrence of the external cause: Secondary | ICD-10-CM | POA: Insufficient documentation

## 2014-05-24 MED ORDER — BACLOFEN 10 MG PO TABS
10.0000 mg | ORAL_TABLET | Freq: Three times a day (TID) | ORAL | Status: AC
Start: 2014-05-24 — End: 2014-06-23

## 2014-05-24 MED ORDER — DICLOFENAC SODIUM 75 MG PO TBEC: 75.0000 mg | DELAYED_RELEASE_TABLET | Freq: Two times a day (BID) | ORAL | Status: AC

## 2014-05-24 MED ORDER — DEXAMETHASONE 4 MG PO TABS: ORAL_TABLET | ORAL | Status: AC
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
## 2014-05-24 NOTE — Discharge Instructions (Signed)
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
## 2014-05-25 NOTE — ED Provider Notes (Signed)
Medical screening examination/treatment/procedure(s) were performed by non-physician practitioner and as supervising physician I was immediately available for consultation/collaboration.     Yavonne Kiss, MD 05/25/14 0059
